# Patient Record
Sex: Male | Born: 1990 | Hispanic: Yes | Marital: Married | Smoking: Current some day smoker
Health system: Southern US, Community
[De-identification: ages and names within clinical notes are randomized; demographics above are authoritative.]

---

## 2009-03-21 ENCOUNTER — Emergency Department (HOSPITAL_COMMUNITY): Admission: EM | Admit: 2009-03-21 | Discharge: 2009-03-21 | Payer: Self-pay | Admitting: Emergency Medicine
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
## 2013-07-24 ENCOUNTER — Encounter (HOSPITAL_COMMUNITY): Payer: Self-pay | Admitting: Emergency Medicine

## 2013-07-24 ENCOUNTER — Emergency Department (HOSPITAL_COMMUNITY)
Admission: EM | Admit: 2013-07-24 | Discharge: 2013-07-24 | Disposition: A | Discharge status: Home / Self Care | Payer: No Typology Code available for payment source | Attending: Emergency Medicine | Admitting: Emergency Medicine

## 2013-07-24 DIAGNOSIS — H6692 Otitis media, unspecified, left ear: Secondary | ICD-10-CM

## 2013-07-24 DIAGNOSIS — K08109 Complete loss of teeth, unspecified cause, unspecified class: Secondary | ICD-10-CM | POA: Insufficient documentation

## 2013-07-24 DIAGNOSIS — H6590 Unspecified nonsuppurative otitis media, unspecified ear: Secondary | ICD-10-CM | POA: Insufficient documentation

## 2013-07-24 DIAGNOSIS — F17200 Nicotine dependence, unspecified, uncomplicated: Secondary | ICD-10-CM | POA: Insufficient documentation

## 2013-07-24 DIAGNOSIS — J3489 Other specified disorders of nose and nasal sinuses: Secondary | ICD-10-CM | POA: Insufficient documentation

## 2013-07-24 MED ORDER — AMOXICILLIN-POT CLAVULANATE 875-125 MG PO TABS: 1.0000 | ORAL_TABLET | Freq: Two times a day (BID) | ORAL | Status: AC

## 2013-07-24 NOTE — ED Notes (Signed)
Patient has yellowish discharge with occasional blood

## 2013-07-24 NOTE — Discharge Instructions (Signed)
1. Medications: augmentin, usual home medications 2. Treatment: rest, drink plenty of fluids,  3. Follow Up: Please followup with your primary doctor for discussion of your diagnoses and further evaluation after today's visit; if you do not have a primary care doctor use the resource guide provided to find one;     Otitis Externa Otitis externa is a germ infection in the outer ear. The outer ear is the area from the eardrum to the outside of the ear. Otitis externa is sometimes called "swimmer's ear." HOME CARE  Put drops in the ear as told by your doctor.  Only take medicine as told by your doctor.  If you have diabetes, your doctor may give you more directions. Follow your doctor's directions.  Keep all doctor visits as told. To avoid another infection:  Keep your ear dry. Use the corner of a towel to dry your ear after swimming or bathing.  Avoid scratching or putting things inside your ear.  Avoid swimming in lakes, dirty water, or pools that use a chemical called chlorine poorly.  You may use ear drops after swimming. Combine equal amounts of white vinegar and alcohol in a bottle. Put 3 or 4 drops in each ear. GET HELP RIGHT AWAY IF:   You have a fever.  Your ear is still red, puffy (swollen), or painful after 3 days.  You still have yellowish-white fluid (pus) coming from the ear after 3 days.  Your redness, puffiness, or pain gets worse.  You have a really bad headache.  You have redness, puffiness, pain, or tenderness behind your ear. MAKE SURE YOU:   Understand these instructions.  Will watch your condition.  Will get help right away if you are not doing well or get worse. Document Released: 08/31/2007 Document Revised: 06/06/2011 Document Reviewed: 03/31/2011 [facility] Patient Information 2014 [employer], [place].

## 2013-07-24 NOTE — ED Notes (Signed)
Patient presents with c/o left ear pain since having a tooth pulled on the top left.  Has completed all antibiotics and taking Advil

## 2013-07-24 NOTE — ED Provider Notes (Signed)
Medical screening examination/treatment/procedure(s) were performed by non-physician practitioner and as supervising physician I was immediately available for consultation/collaboration.   EKG Interpretation None       Martha K Linker, MD 07/24/13 2231 

## 2013-07-24 NOTE — ED Provider Notes (Signed)
CSN: [id number]     Arrival date & time 07/24/13  2003 History  This chart was scribed for non-physician practitioner Dierdre ForthHannah Hjalmar Ballengee, PA-C working with Ethelda ChickMartha K Linker, MD by Dorothey Basemania Sutton, ED Scribe. This patient was seen in room TR09C/TR09C and the patient's care was started at 9:32 PM.    Chief Complaint  Patient presents with  . Otalgia   The history is provided by the patient. No language interpreter was used.   HPI Comments: Patrick Cross is a 23 y.o. male who presents to the Emergency Department complaining of a constant pain to the left ear onset about 2 weeks ago ago after he reports having an upper, left tooth extracted. He reports associated yellow-colored drainage from the ear. He states that he completed the course of antibiotics (PCN) for his tooth and is currently taking Advil at home with mild, temporary relief. He denies fever, chills, nausea, vomiting, hearing loss, congestion, sore throat, cough. He denies any allergies to medications. Patient has no other pertinent medical history.   History reviewed. No pertinent past medical history. History reviewed. No pertinent past surgical history. History reviewed. No pertinent family history. History  Substance Use Topics  . Smoking status: Current Some Day Smoker  . Smokeless tobacco: Never Used  . Alcohol Use: Yes    Review of Systems  Constitutional: Negative for fever and chills.  HENT: Positive for ear discharge and ear pain. Negative for congestion, hearing loss and sore throat.   Respiratory: Negative for cough.   Gastrointestinal: Negative for nausea and vomiting.  All other systems reviewed and are negative.     Allergies  Review of patient's allergies indicates no known allergies.  Home Medications   Prior to Admission medications   Not on File   Triage Vitals: BP 134/70  Pulse 75  Temp(Src) 98.3 F (36.8 C) (Oral)  Resp 18  Ht 5\' 11"  (1.803 m)  Wt 169 lb 12.8 oz (77.021 kg)  BMI 23.69 kg/m2  SpO2  100%  Physical Exam  Constitutional: He is oriented to person, place, and time. He appears well-developed and well-nourished. No distress.  HENT:  Head: Normocephalic and atraumatic.  Right Ear: Tympanic membrane, external ear and ear canal normal.  Left Ear: External ear and ear canal normal. Tympanic membrane is erythematous and bulging. A middle ear effusion is present.  Nose: Mucosal edema and rhinorrhea present. No epistaxis. Right sinus exhibits no maxillary sinus tenderness and no frontal sinus tenderness. Left sinus exhibits no maxillary sinus tenderness and no frontal sinus tenderness.  Mouth/Throat: Uvula is midline and mucous membranes are normal. Mucous membranes are not pale and not cyanotic. No dental abscesses, uvula swelling or dental caries. No oropharyngeal exudate, posterior oropharyngeal edema, posterior oropharyngeal erythema or tonsillar abscesses.    Left TM is bulging and erythematous with a purulent effusion.   Tooth #15 surgically absent; no evidence of abscess in the space, no erythema or swelling of the gingiva, no gross abscess    Eyes: Conjunctivae are normal. Pupils are equal, round, and reactive to light.  Neck: Normal range of motion and full passive range of motion without pain.  Cardiovascular: Normal rate and intact distal pulses.   Pulmonary/Chest: Effort normal and breath sounds normal. No stridor.  Abdominal: Soft. Bowel sounds are normal. There is no tenderness.  Musculoskeletal: Normal range of motion.  Lymphadenopathy:    He has no cervical adenopathy.  Neurological: He is alert and oriented to person, place, and time.  Skin: Skin is  dry. No rash noted. He is not diaphoretic.  Psychiatric: He has a normal mood and affect.    ED Course  Procedures (including critical care time)  DIAGNOSTIC STUDIES: Oxygen Saturation is 100% on room air, normal by my interpretation.    COORDINATION OF CARE: 9:35 PM- Discussed that symptoms appear to be due  to an ear infection. Will discharge patient with antibiotics to treat. Return precautions given. Discussed treatment plan with patient at bedside and patient verbalized agreement.     Labs Review Labs Reviewed - No data to display  Imaging Review No results found.   EKG Interpretation None      MDM   Final diagnoses:  Left otitis media   Patrick Cross presents with otalgia and exam consistent with acute otitis media. No concern for acute mastoiditis, meningitis.  Recent antibiotic use for dental extraction with PCN.  Patient discharged home with Augmentin.  Advised [facility] 3 days to ensure that OM is healing.  .  I have also discussed reasons to return immediately to the ER including headaches, fevers or erythema of the ear.  Patient expresses understanding and agrees with plan.  It has been determined that no acute conditions requiring further emergency intervention are present at this time. The patient/guardian have been advised of the diagnosis and plan. We have discussed signs and symptoms that warrant return to the ED, such as changes or worsening in symptoms.   Vital signs are stable at discharge.   BP 118/77  Pulse 77  Temp(Src) 98.3 F (36.8 C) (Oral)  Resp 16  Ht 5\' 11"  (1.803 m)  Wt 169 lb 12.8 oz (77.021 kg)  BMI 23.69 kg/m2  SpO2 97%  Patient/guardian has voiced understanding and agreed to follow-up with the PCP or specialist.    I personally performed the services described in this documentation, which was scribed in my presence. The recorded information has been reviewed and is accurate.      Dahlia ClientHannah Missie Gehrig, PA-C 07/24/13 2204
